# Patient Record
Sex: Female | Born: 1985 | Race: White | Hispanic: No | Marital: Married | Smoking: Never smoker
Health system: Southern US, Community
[De-identification: ages and names within clinical notes are randomized; demographics above are authoritative.]

## PROBLEM LIST (undated history)

## (undated) DIAGNOSIS — F419 Anxiety disorder, unspecified: Secondary | ICD-10-CM

## (undated) HISTORY — PX: TUBAL LIGATION: SHX77

---

## 2021-02-20 ENCOUNTER — Other Ambulatory Visit: Payer: Self-pay

## 2021-02-20 ENCOUNTER — Ambulatory Visit
Admission: EM | Admit: 2021-02-20 | Discharge: 2021-02-20 | Disposition: A | Discharge status: Home / Self Care | Payer: Self-pay

## 2021-02-20 ENCOUNTER — Encounter: Payer: Self-pay | Admitting: Emergency Medicine

## 2021-02-20 ENCOUNTER — Ambulatory Visit (INDEPENDENT_AMBULATORY_CARE_PROVIDER_SITE_OTHER): Payer: Self-pay

## 2021-02-20 DIAGNOSIS — S92514A Nondisplaced fracture of proximal phalanx of right lesser toe(s), initial encounter for closed fracture: Secondary | ICD-10-CM

## 2021-02-20 DIAGNOSIS — M79671 Pain in right foot: Secondary | ICD-10-CM

## 2021-02-20 HISTORY — DX: Anxiety disorder, unspecified: F41.9

## 2021-02-20 MED ORDER — HYDROCODONE-ACETAMINOPHEN 5-325 MG PO TABS: 1.0000 | ORAL_TABLET | Freq: Four times a day (QID) | ORAL | 0 refills | Status: AC | PRN

## 2021-02-20 MED ORDER — NAPROXEN 500 MG PO TABS: 500.0000 mg | ORAL_TABLET | Freq: Two times a day (BID) | ORAL | 0 refills | Status: AC

## 2021-02-20 NOTE — ED Triage Notes (Signed)
Pt reports toe pain of fifth digit on right foot. Pt reports stubbed toe on play pen yesterday.pt buddy taped fourth and fifth digit together and reports continued pain with ambulation. Moderate swelling and bruising noted to fifth digit.

## 2021-02-20 NOTE — ED Provider Notes (Signed)
°  [facility]   MRN: [id number] DOB: 01-12-1986  Subjective:   Cynthia Fowler is a 36 y.o. female presenting for 1 day history of a right foot injury.  Patient accidentally stubbed her lateral foot/pinky toe against her child's playpen.  She has had significant swelling, pain, bruising.  Has tried to buddy tape it but it is not helping.  No open wounds.  No current facility-administered medications for this encounter.  Current Outpatient Medications:    escitalopram (LEXAPRO) 20 MG tablet, Take 20 mg by mouth daily., Disp: , Rfl:    No known food or drug allergies.   Past Medical History:  Diagnosis Date   Anxiety      Past Surgical History:  Procedure Laterality Date   TUBAL LIGATION      History reviewed. No pertinent family history.  Social History   Tobacco Use   Smoking status: Never   Smokeless tobacco: Never  Substance Use Topics   Alcohol use: Never   Drug use: Never    ROS   Objective:   Vitals: BP 110/65 (BP Location: Right Arm)    Pulse 71    Temp 98.1 F (36.7 C) (Oral)    Resp 18    Ht 5\' 5"  (1.651 m)    Wt 147 lb (66.7 kg)    LMP 01/21/2021 (Approximate)    SpO2 96%    BMI 24.46 kg/m   Physical Exam Constitutional:      General: She is not in acute distress.    Appearance: Normal appearance. She is well-developed. She is not ill-appearing.  HENT:     Head: Normocephalic and atraumatic.     Nose: Nose normal.     Mouth/Throat:     Mouth: Mucous membranes are moist.  Eyes:     General: No scleral icterus.    Extraocular Movements: Extraocular movements intact.  Cardiovascular:     Rate and Rhythm: Normal rate.  Pulmonary:     Effort: Pulmonary effort is normal.  Musculoskeletal:       Feet:  Skin:    General: Skin is warm and dry.  Neurological:     General: No focal deficit present.     Mental Status: She is alert and oriented to person, place, and time.  Psychiatric:        Mood and Affect: Mood normal.         Behavior: Behavior normal.    DG Foot Complete Right  Result Date: 02/20/2021 CLINICAL DATA:  Stubbed little toe.  Pain. EXAM: RIGHT FOOT COMPLETE - 3+ VIEW COMPARISON:  None. FINDINGS: Three views study shows a comminuted fracture involving the mid diaphysis of the pinky toe proximal phalanx. No substantial distraction fracture fragments. No other acute fracture evident. IMPRESSION: Comminuted fracture involving the mid diaphysis of the pinky toe proximal phalanx. Electronically Signed   By: 02/22/2021 M.D.   On: 02/20/2021 11:19     Assessment and Plan :   PDMP not reviewed this encounter.  1. Closed nondisplaced fracture of proximal phalanx of lesser toe of right foot, initial encounter   2. Right foot pain    Patient placed into a postop shoe.  Recommended naproxen for her regular pain control, hydrocodone for breakthrough pain.  Follow-up with podiatry. Counseled patient on potential for adverse effects with medications prescribed/recommended today, ER and return-to-clinic precautions discussed, patient verbalized understanding.    02/22/2021, PA-C 02/20/21 1154

## 2021-07-05 ENCOUNTER — Other Ambulatory Visit: Payer: Self-pay

## 2021-07-05 ENCOUNTER — Emergency Department (HOSPITAL_COMMUNITY)
Admission: EM | Admit: 2021-07-05 | Discharge: 2021-07-06 | Disposition: A | Discharge status: Home / Self Care | Payer: Self-pay | Attending: Emergency Medicine | Admitting: Emergency Medicine

## 2021-07-05 ENCOUNTER — Emergency Department (HOSPITAL_COMMUNITY): Payer: Self-pay

## 2021-07-05 ENCOUNTER — Encounter (HOSPITAL_COMMUNITY): Payer: Self-pay | Admitting: Emergency Medicine

## 2021-07-05 DIAGNOSIS — M7582 Other shoulder lesions, left shoulder: Secondary | ICD-10-CM | POA: Insufficient documentation

## 2021-07-05 NOTE — ED Triage Notes (Signed)
Pt c/o left shoulder pain that radiates down left arm and makes fingers numb x 1 week. Certain ROM makes it worse. Pt denies any known injury. Radial pulse wnl. No deformities noted.

## 2021-07-06 MED ORDER — NAPROXEN 500 MG PO TABS: 500.0000 mg | ORAL_TABLET | Freq: Two times a day (BID) | ORAL | 0 refills | Status: AC

## 2021-07-06 NOTE — Discharge Instructions (Signed)
You were evaluated in the Emergency Department and after careful evaluation, we did not find any emergent condition requiring admission or further testing in the hospital.  Your exam/testing today was overall reassuring.  Symptoms seem to be due to an inflamed tendon in the rotator cuff.  Recommend rest, use the Naprosyn provided for pain.  Recommend follow-up with an orthopedic specialist if not improved in 2 weeks.  Please return to the Emergency Department if you experience any worsening of your condition.  Thank you for allowing [place] to be a part of your care.

## 2021-07-06 NOTE — ED Provider Notes (Signed)
[facility] Emergency Department Provider Note MRN:  [id number]  Arrival date & time: 07/06/21     Chief Complaint   Shoulder Pain   History of Present Illness   Cynthia Fowler is a 36 y.o. year-old female with no pertinent past medical history presenting to the ED with chief complaint of shoulder pain.  Pain to the left shoulder for about a week, worse with motion or palpation.  Trouble moving the shoulder due to pain.  Works on an [employer], repetitive motions.  No trauma, no fever, no other complaints.  Review of Systems  A thorough review of systems was obtained and all systems are negative except as noted in the HPI and PMH.   Patient's Health History    Past Medical History:  Diagnosis Date   Anxiety     Past Surgical History:  Procedure Laterality Date   TUBAL LIGATION      History reviewed. No pertinent family history.  Social History   Socioeconomic History   Marital status: Married    Spouse name: Not on file   Number of children: Not on file   Years of education: Not on file   Highest education level: Not on file  Occupational History   Not on file  Tobacco Use   Smoking status: Never   Smokeless tobacco: Never  Vaping Use   Vaping Use: Every day  Substance and Sexual Activity   Alcohol use: Never   Drug use: Never   Sexual activity: Not on file  Other Topics Concern   Not on file  Social History Narrative   Not on file   Social Determinants of Health   Financial Resource Strain: Not on file  Food Insecurity: Not on file  Transportation Needs: Not on file  Physical Activity: Not on file  Stress: Not on file  Social Connections: Not on file  Intimate Partner Violence: Not on file     Physical Exam   Vitals:   07/05/21 2009 07/06/21 0037  BP: 111/75 106/67  Pulse: 91 63  Resp: 17 16  Temp: 99.6 F (37.6 C)   SpO2: 99% 99%    CONSTITUTIONAL: Well-appearing, NAD NEURO/PSYCH:  Alert and  oriented x 3, no focal deficits EYES:  eyes equal and reactive ENT/NECK:  no LAD, no JVD CARDIO: Regular rate, well-perfused, normal S1 and S2 PULM:  CTAB no wheezing or rhonchi GI/GU:  non-distended, non-tender MSK/SPINE:  No gross deformities, no edema, pain elicited with range of motion of the left shoulder, Spurling's sign negative, normal range of motion of the neck SKIN:  no rash, atraumatic   *Additional and/or pertinent findings included in MDM below  Diagnostic and Interventional Summary    EKG Interpretation  Date/Time:    Ventricular Rate:    PR Interval:    QRS Duration:   QT Interval:    QTC Calculation:   R Axis:     Text Interpretation:         Labs Reviewed - No data to display  DG Shoulder Left  Final Result      Medications - No data to display   Procedures  /  Critical Care Procedures  ED Course and Medical Decision Making  Initial Impression and Ddx Symptoms are consistent with rotator cuff tendinitis.  Nothing to suggest radicular cervical etiology of any kind.  No myelopathy symptoms.  Appropriate for discharge with conservative management.  Past medical/surgical history that increases complexity of ED encounter: None  Interpretation of Diagnostics I personally reviewed the shoulder x-ray and my interpretation is as follows: No obvious fracture    Patient Reassessment and Ultimate Disposition/Management    Discharge home  Patient management required discussion with the following services or consulting groups:  None  Complexity of Problems Addressed Acute complicated illness or Injury  Additional Data Reviewed and Analyzed Further history obtained from: None  Additional Factors Impacting ED Encounter Risk Prescriptions  Elmer Sow. Pilar Plate, MD [facility] Emergency Medicine [facility] [email or web address]  Final Clinical Impressions(s) / ED Diagnoses     ICD-10-CM   1. Tendinitis of left rotator cuff  M75.82        ED Discharge Orders          Ordered    naproxen (NAPROSYN) 500 MG tablet  2 times daily        07/06/21 0044             Discharge Instructions Discussed with and Provided to Patient:    Discharge Instructions      You were evaluated in the Emergency Department and after careful evaluation, we did not find any emergent condition requiring admission or further testing in the hospital.  Your exam/testing today was overall reassuring.  Symptoms seem to be due to an inflamed tendon in the rotator cuff.  Recommend rest, use the Naprosyn provided for pain.  Recommend follow-up with an orthopedic specialist if not improved in 2 weeks.  Please return to the Emergency Department if you experience any worsening of your condition.  Thank you for allowing [place] to be a part of your care.       Sabas Sous, MD 07/06/21 [phone]

## 2023-01-09 IMAGING — DX DG SHOULDER 2+V*L*
3 series · 3 of 3 positions shown · non-contrast
Comparison: None Available.

CLINICAL DATA: Pt c/o left shoulder pain that starts in neck and
radiates down arm x 1 week NKI

EXAM:
LEFT SHOULDER - 2+ VIEW

[shoulder grashey]
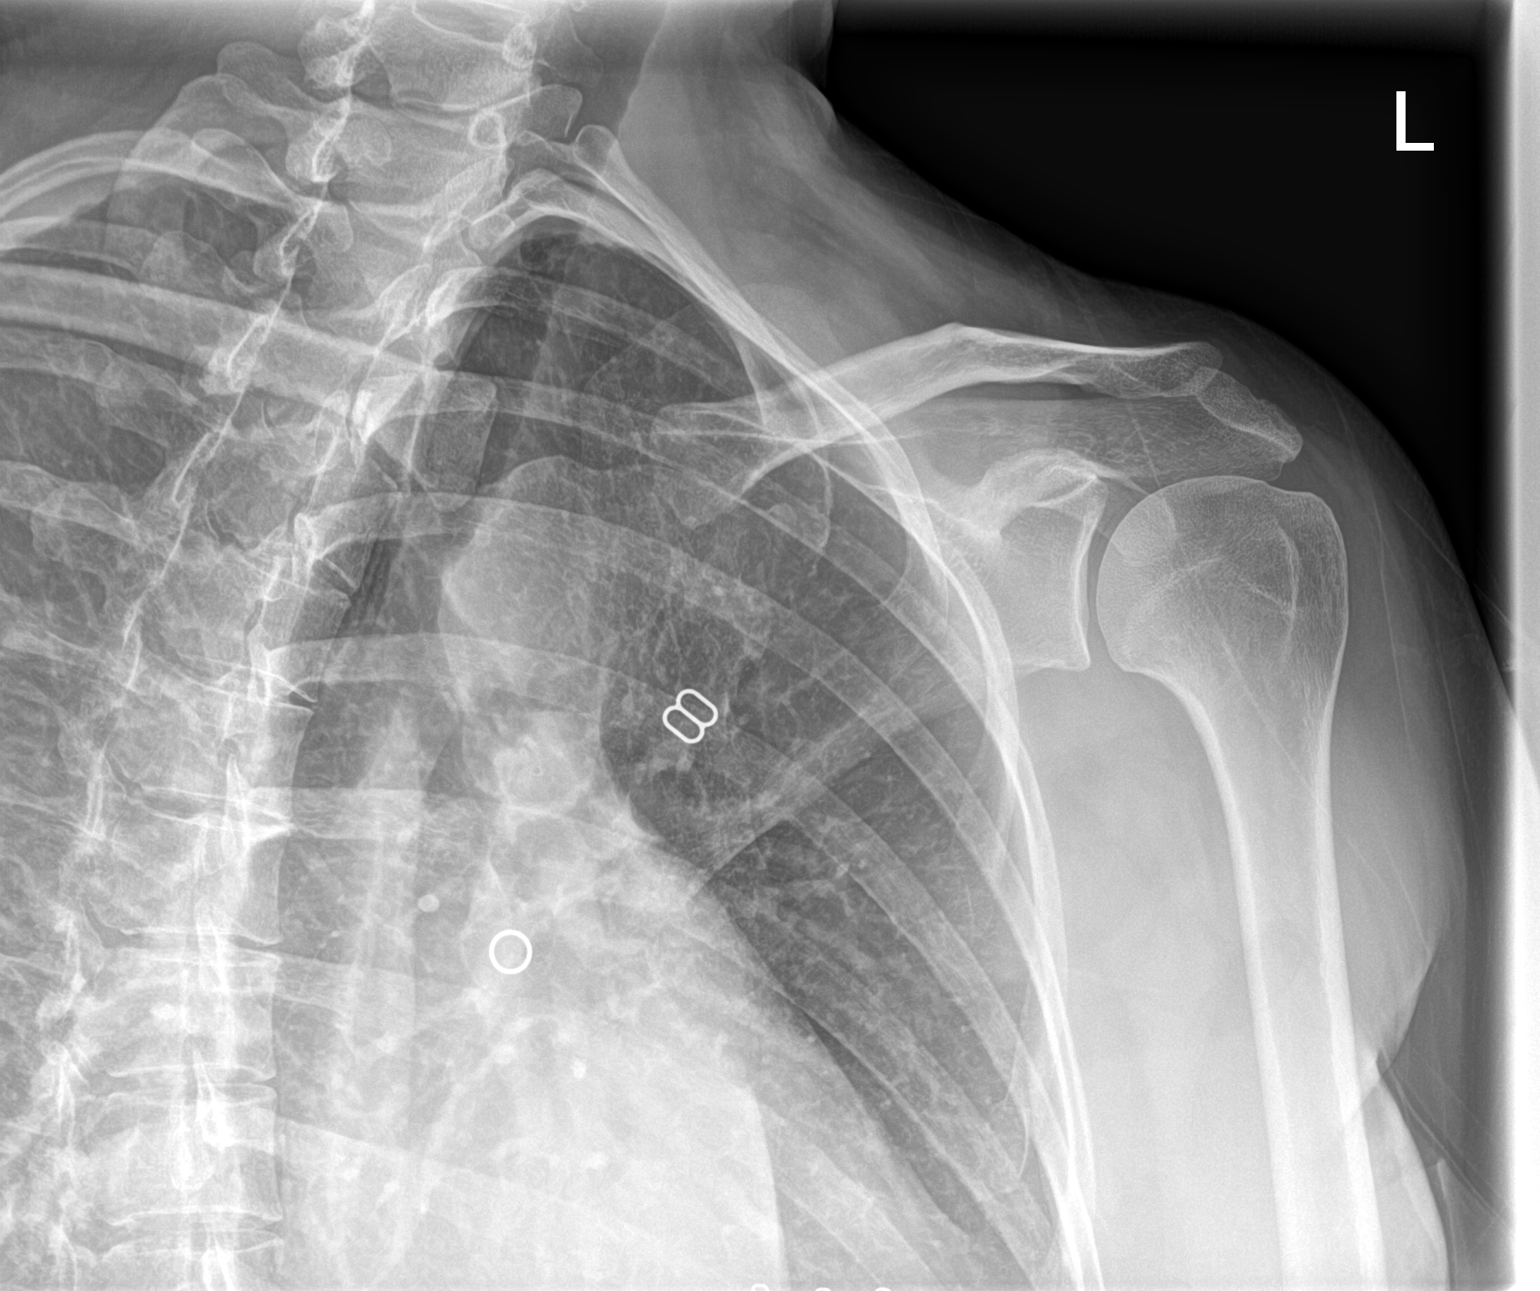

[shoulder y view]
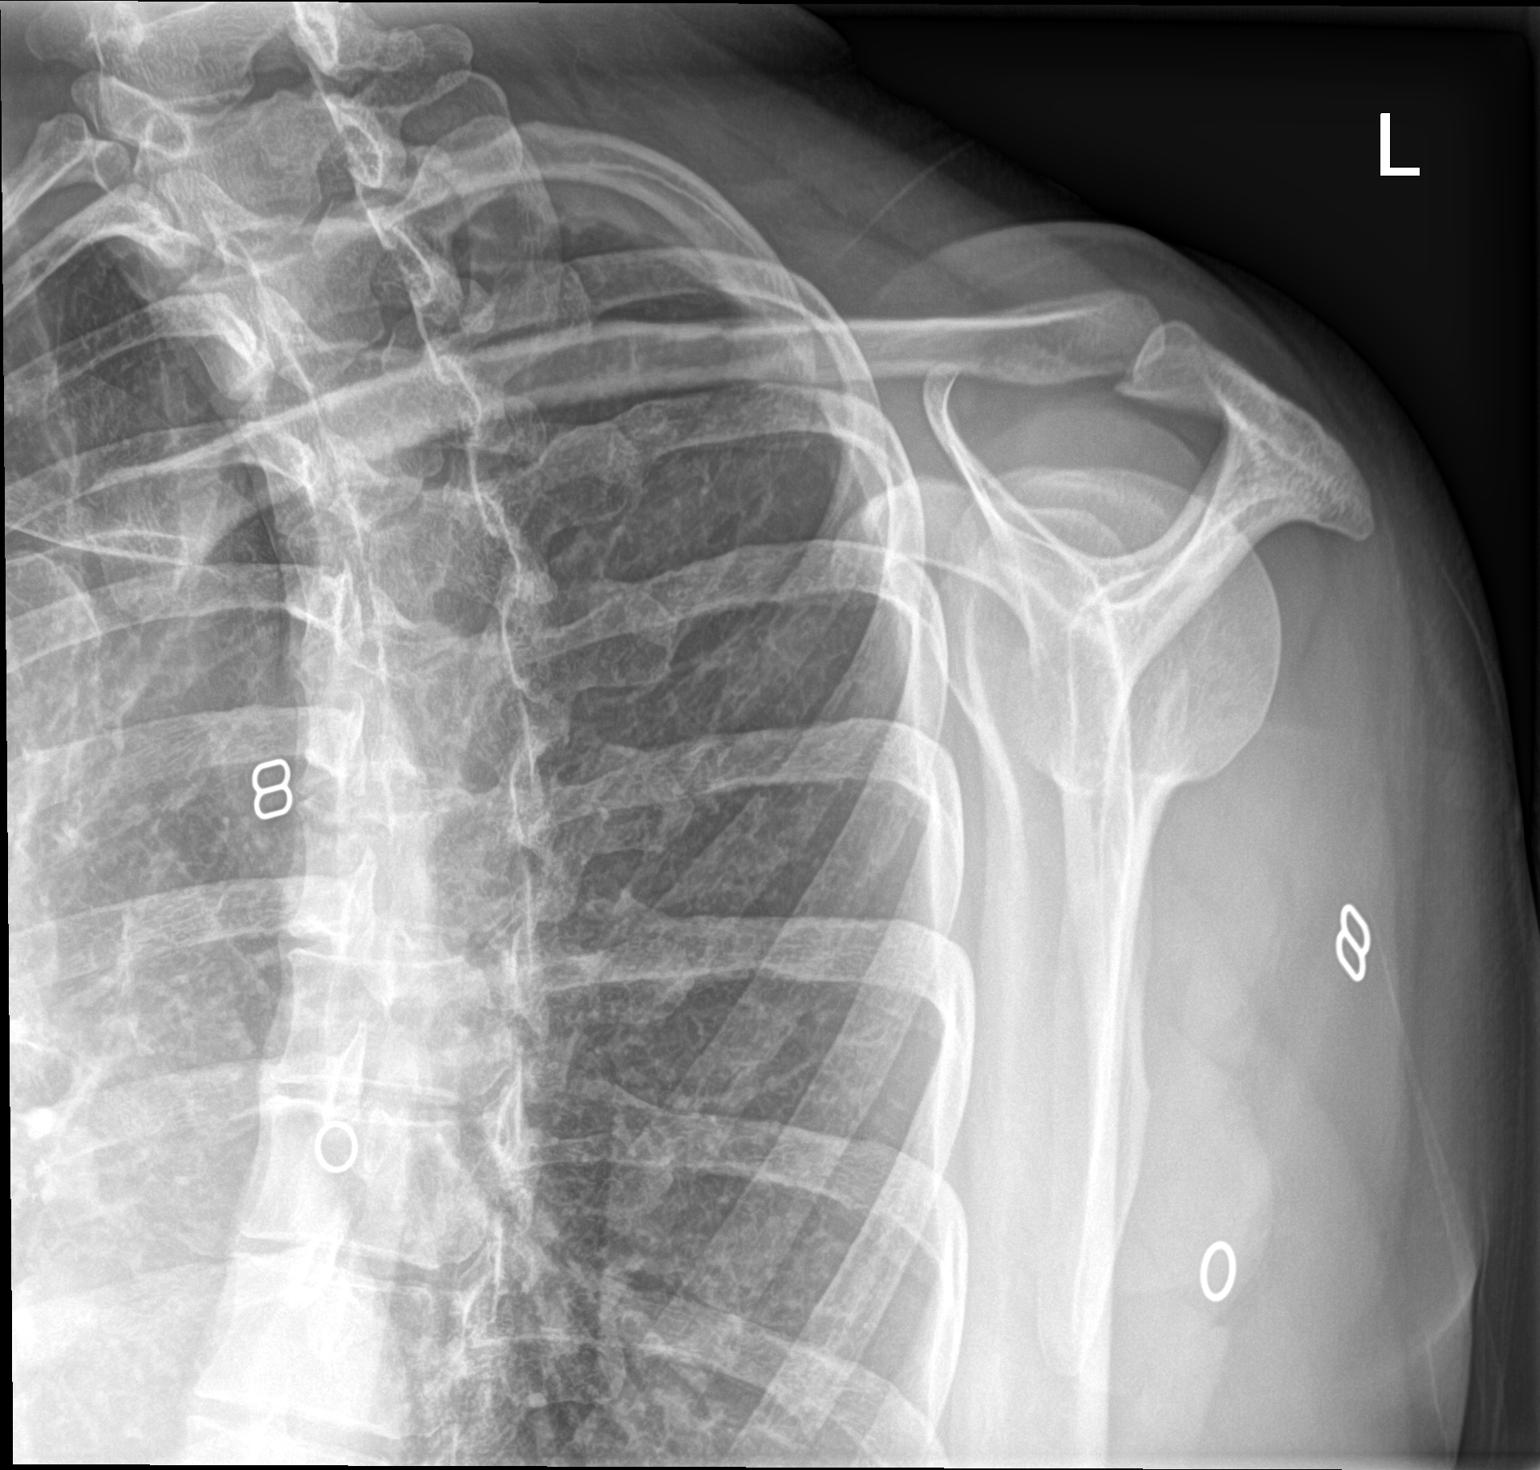

[shoulder axillary]
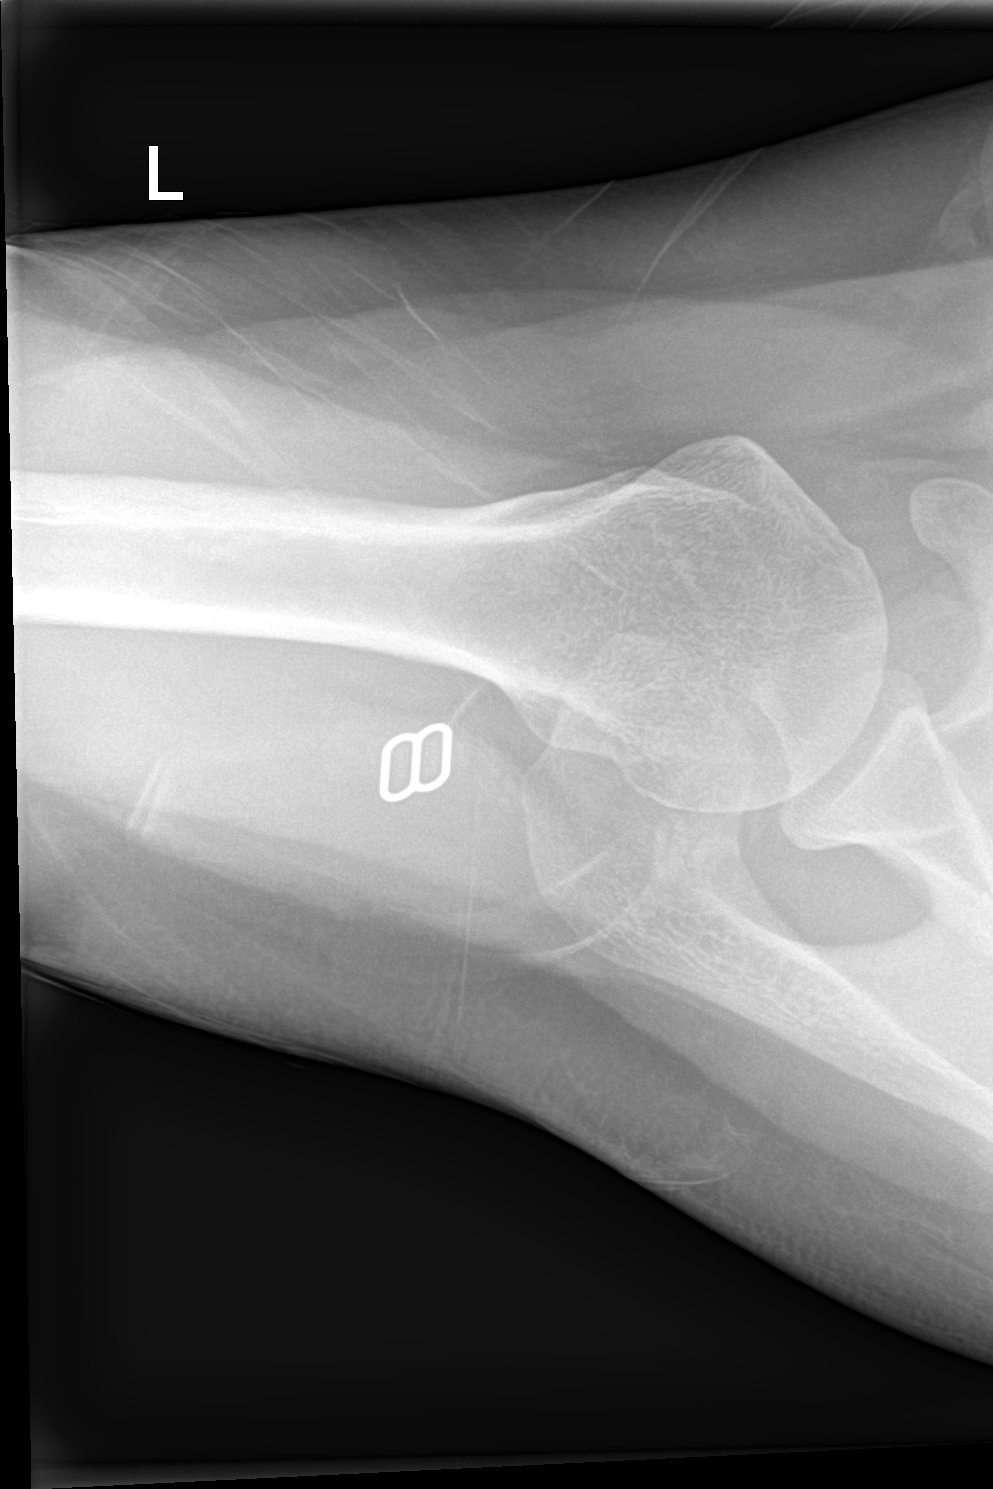

[3 of 3 positions shown; findings below may reference images not displayed]

FINDINGS: There is no evidence of fracture or dislocation. There is no
evidence of arthropathy or other focal bone abnormality. Soft
tissues are unremarkable.
IMPRESSION: No acute displaced fracture or dislocation.
